# Patient Record
Sex: Female | Born: 2005
Health system: Southern US, Community
[De-identification: ages and names within clinical notes are randomized; demographics above are authoritative.]

## PROBLEM LIST (undated history)

## (undated) DIAGNOSIS — R112 Nausea with vomiting, unspecified: Secondary | ICD-10-CM

## (undated) DIAGNOSIS — F419 Anxiety disorder, unspecified: Secondary | ICD-10-CM

## (undated) DIAGNOSIS — N83209 Unspecified ovarian cyst, unspecified side: Secondary | ICD-10-CM

## (undated) DIAGNOSIS — Z9889 Other specified postprocedural states: Secondary | ICD-10-CM

## (undated) DIAGNOSIS — T7840XA Allergy, unspecified, initial encounter: Secondary | ICD-10-CM

## (undated) DIAGNOSIS — M2342 Loose body in knee, left knee: Secondary | ICD-10-CM

## (undated) HISTORY — PX: OTHER SURGICAL HISTORY: SHX169

## (undated) HISTORY — DX: Unspecified ovarian cyst, unspecified side: N83.209

---
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
## 2023-03-23 ENCOUNTER — Ambulatory Visit
Admission: RE | Admit: 2023-03-23 | Discharge: 2023-03-23 | Disposition: A | Discharge status: Home / Self Care | Payer: 59 | Source: Ambulatory Visit | Attending: Family Medicine | Admitting: Family Medicine

## 2023-03-23 ENCOUNTER — Ambulatory Visit (INDEPENDENT_AMBULATORY_CARE_PROVIDER_SITE_OTHER): Payer: 59 | Admitting: Radiology

## 2023-03-23 VITALS — BP 111/72 | HR 100 | Temp 98.6°F | Resp 18 | Ht 62.0 in | Wt 130.0 lb

## 2023-03-23 DIAGNOSIS — R059 Cough, unspecified: Secondary | ICD-10-CM | POA: Diagnosis not present

## 2023-03-23 DIAGNOSIS — J019 Acute sinusitis, unspecified: Secondary | ICD-10-CM | POA: Diagnosis not present

## 2023-03-23 MED ORDER — AMOXICILLIN-POT CLAVULANATE 875-125 MG PO TABS: 1.0000 | ORAL_TABLET | Freq: Two times a day (BID) | ORAL | 0 refills | Status: DC

## 2023-03-23 NOTE — Discharge Instructions (Addendum)
The x-ray reading we discussed is preliminary. Your x-ray will be read by a radiologist in next few hours. If there is a discrepancy, you will be contacted, and instructed on a new plan for you care.

## 2023-03-23 NOTE — ED Provider Notes (Signed)
 [facility]    CSN: [id number] Arrival date & time: 03/23/23  1423      History   Chief Complaint Chief Complaint  Patient presents with   Sore Throat    Sick on/off for 2 weeks, cough that hurts, pressure behind eyes, loss of voice, temp 99.0, headache, went to a different urgent care on 12/26. - Entered by patient   Cough    HPI Krista Klein is a 18 y.o. female.   The history is provided by the patient.  Sore Throat Pertinent negatives include no shortness of breath.  Cough Associated symptoms: rhinorrhea and sore throat   Associated symptoms: no shortness of breath and no wheezing   Wet cough, loss of voice, chest congestion, nasal congestion, pressure behind her eyes.  States she was sick at Christmas was seen day after Christmas tested negative for COVID and strep.  Felt better for a few days then symptoms worsened.  Had loss of voice which has improved.  Admits soreness in her chest when she coughs   Past Medical History:  Diagnosis Date   Allergy    environmental   Anxiety    no meds taken per mother has therapy for q month   Loose body of left knee    PONV (postoperative nausea and vomiting)    after cyst removed age 32 or 4    There are no active problems to display for this patient.   Past Surgical History:  Procedure Laterality Date   cyst from neck removed  age 5 or 4   KNEE ARTHROSCOPY Left 03/15/2020   Procedure: ARTHROSCOPY LEFT KNEE WITH LOOSE BODY REMOVAL AND LIMITED SYNOVECTOMY;  Surgeon: Sharl Selinda Dover, MD;  Location: [facility];  Service: Orthopedics;  Laterality: Left;    OB History   No obstetric history on file.      Home Medications    Prior to Admission medications   Medication Sig Start Date End Date Taking? Authorizing Provider  amoxicillin -clavulanate (AUGMENTIN ) 875-125 MG tablet Take 1 tablet by mouth every 12 (twelve) hours. 03/23/23  Yes Jerimy Johanson, Jon, PA  JUNEL FE 1.5/30 1.5-30 MG-MCG  tablet Take 1 tablet by mouth daily. 01/22/23  Yes [provider]  acetaminophen  (TYLENOL ) 500 MG tablet Take 500 mg by mouth every 6 (six) hours as needed.    [provider]  cetirizine (ZYRTEC) 10 MG tablet Take 10 mg by mouth daily as needed for allergies.    [provider]  Ibuprofen 200 MG CAPS Take by mouth.    [provider]  ondansetron  (ZOFRAN  ODT) 4 MG disintegrating tablet Take 1 tablet (4 mg total) by mouth every 8 (eight) hours as needed for nausea or vomiting. 03/15/20   Sharl Selinda Dover, MD    Family History History reviewed. No pertinent family history.  Social History Social History   Tobacco Use   Smoking status: Never   Smokeless tobacco: Never  Vaping Use   Vaping status: Never Used  Substance Use Topics   Alcohol use: Never   Drug use: Never     Allergies   Patient has no known allergies.   Review of Systems Review of Systems  Constitutional:  Positive for fatigue.  HENT:  Positive for rhinorrhea, sinus pressure, sore throat and voice change. Negative for ear discharge and facial swelling.   Respiratory:  Positive for cough. Negative for shortness of breath and wheezing.      Physical Exam Triage Vital Signs ED  Triage Vitals  Encounter Vitals Group     BP 03/23/23 1444 (!) 145/95     Systolic BP Percentile --      Diastolic BP Percentile --      Pulse Rate 03/23/23 1444 100     Resp 03/23/23 1444 18     Temp 03/23/23 1444 98.6 F (37 C)     Temp Source 03/23/23 1444 Oral     SpO2 03/23/23 1444 97 %     Weight 03/23/23 1442 130 lb (59 kg)     Height 03/23/23 1442 5' 2 (1.575 m)     Head Circumference --      Peak Flow --      Pain Score 03/23/23 1441 2     Pain Loc --      Pain Education --      Exclude from Growth Chart --    No data found.  Updated Vital Signs BP 111/72 (BP Location: Right Arm) Comment: retake.  Pulse 100   Temp 98.6 F (37 C) (Oral)   Resp 18   Ht 5' 2 (1.575 m)    Wt 130 lb (59 kg)   LMP 03/15/2023 (Exact Date)   SpO2 97%   BMI 23.78 kg/m   Visual Acuity Right Eye Distance:   Left Eye Distance:   Bilateral Distance:    Right Eye Near:   Left Eye Near:    Bilateral Near:     Physical Exam Vitals and nursing note reviewed.  Constitutional:      Appearance: She is not ill-appearing.  HENT:     Head: Normocephalic and atraumatic.     Right Ear: Tympanic membrane and ear canal normal.     Left Ear: Tympanic membrane and ear canal normal.     Nose: Congestion present. No rhinorrhea.     Mouth/Throat:     Mouth: Mucous membranes are moist.     Pharynx: No pharyngeal swelling, oropharyngeal exudate, posterior oropharyngeal erythema or uvula swelling.     Tonsils: No tonsillar exudate or tonsillar abscesses.     Comments: Hoarse voice, normal swallowing Eyes:     Conjunctiva/sclera: Conjunctivae normal.  Cardiovascular:     Rate and Rhythm: Tachycardia present.     Heart sounds: Normal heart sounds.  Pulmonary:     Effort: Pulmonary effort is normal. No respiratory distress.     Breath sounds: Normal breath sounds. No wheezing, rhonchi or rales.  Musculoskeletal:     Cervical back: Neck supple.  Lymphadenopathy:     Cervical: No cervical adenopathy.  Neurological:     Mental Status: She is alert.      UC Treatments / Results  Labs (all labs ordered are listed, but only abnormal results are displayed) Labs Reviewed - No data to display  EKG   Radiology No results found.  Procedures Procedures (including critical care time)  Medications Ordered in UC Medications - No data to display  Initial Impression / Assessment and Plan / UC Course  I have reviewed the triage vital signs and the nursing notes.  Pertinent labs & imaging results that were available during my care of the patient were reviewed by me and considered in my medical decision making (see chart for details).     18 year old female with febrile illness  started on Christmas Day, was better for a few days and symptoms recurred.  Admits with cough, her lungs are clear to auscultation but she is mildly tachycardic.  No known contacts with illness.  Will check chest x-ray Chest x-ray independently viewed by me NAD.  Admits yellow nasal drainage and pressure behind eyes will treat for sinusitis OTC Robitussin DM for cough, follow-up with PCP, discussed with patient x-ray will be read by radiologist we will contact patient if there is a radiologic discrepancy Final Clinical Impressions(s) / UC Diagnoses   Final diagnoses:  Cough, unspecified type  Acute sinusitis, recurrence not specified, unspecified location     Discharge Instructions      The x-ray reading we discussed is preliminary. Your x-ray will be read by a radiologist in next few hours. If there is a discrepancy, you will be contacted, and instructed on a new plan for you care.       ED Prescriptions     Medication Sig Dispense Auth. Provider   amoxicillin -clavulanate (AUGMENTIN ) 875-125 MG tablet Take 1 tablet by mouth every 12 (twelve) hours. 14 tablet Ada Holness, [place]      PDMP not reviewed this encounter.   January Bergthold, [place] 03/23/23 1513

## 2023-03-23 NOTE — ED Triage Notes (Signed)
 Pt presents with complaints of cough, headaches, loss of voice, and some congestion (in chest and nasal cavity). Pt states she was seen at another urgent care facility on 12/26, prescribed prednisone & there was improvement. However, pt's work required her to be outside on Thursday 1/2. Pt's symptoms returned and were worse than before. Only other medication taken is Zyrtec. Pt currently rates her overall pain a 2/10.

## 2023-03-27 ENCOUNTER — Ambulatory Visit: Payer: 59 | Admitting: Family

## 2023-03-27 VITALS — BP 118/63 | HR 91 | Temp 98.6°F | Resp 16 | Ht 62.5 in | Wt 133.0 lb

## 2023-03-27 DIAGNOSIS — H7202 Central perforation of tympanic membrane, left ear: Secondary | ICD-10-CM | POA: Insufficient documentation

## 2023-03-27 DIAGNOSIS — R052 Subacute cough: Secondary | ICD-10-CM | POA: Diagnosis not present

## 2023-03-27 DIAGNOSIS — T7840XA Allergy, unspecified, initial encounter: Secondary | ICD-10-CM | POA: Insufficient documentation

## 2023-03-27 DIAGNOSIS — H7292 Unspecified perforation of tympanic membrane, left ear: Secondary | ICD-10-CM | POA: Insufficient documentation

## 2023-03-27 DIAGNOSIS — T7840XD Allergy, unspecified, subsequent encounter: Secondary | ICD-10-CM | POA: Diagnosis not present

## 2023-03-27 NOTE — Assessment & Plan Note (Addendum)
 Stable with zyrtec.  Did shots as a child. Monitor.

## 2023-03-27 NOTE — Assessment & Plan Note (Signed)
 Secondary to trauma about 18 months ago. Has some perceived hearing loss and occasional discomfort. Refer to ENT for further evaluation.

## 2023-03-27 NOTE — Patient Instructions (Signed)
 VISIT SUMMARY:  You visited today due to a respiratory illness that started last Thursday, with symptoms including a sore throat, cough, voice loss, pressure behind the eyes, and a headache. You have a history of environmental allergies, a previous knee injury, and a past ear injury. We discussed your current symptoms and ongoing management for these conditions.  YOUR PLAN:  -UPPER RESPIRATORY INFECTION: An upper respiratory infection is a common viral infection that affects the nose, throat, and airways. Continue taking Robitussin as needed for your cough. If it does not help, we may consider prescribing a stronger cough medicine.  -ENVIRONMENTAL ALLERGIES: Environmental allergies are reactions to substances like dust, grass, and tree pollen. Continue taking Zyrtec as needed. If your symptoms get worse, we might refer you to an allergist.  -LEFT KNEE INJURY: You have a history of a dislocated knee that required surgery. Currently, you have occasional pain and clicking. Keep monitoring your knee, and seek medical attention if the pain or discomfort increases.  -PERFORATED EARDRUM: A perforated eardrum is a hole or tear in the eardrum, often caused by injury. This can lead to hearing loss and discomfort. We will refer you to an Ear, Nose, and Throat specialist for further evaluation and possible surgical repair.  -GENERAL HEALTH MAINTENANCE: It is important to schedule a physical examination at your convenience to review your overall health and address any other concerns.  INSTRUCTIONS:  Please continue taking Robitussin as needed for your cough. If it does not help, contact us  for a prescription cough medicine. Continue taking Zyrtec for your allergies as needed. Monitor your knee for any increase in pain or discomfort and seek medical attention if needed. We will refer you to an Ear, Nose, and Throat specialist for your perforated eardrum. Additionally, schedule a physical examination at your  convenience.

## 2023-03-27 NOTE — Assessment & Plan Note (Signed)
 Residual from recent respiratory infection. She will complete augmentin for sinusitis and continue Robitussin as needed for cough.

## 2023-03-27 NOTE — Progress Notes (Signed)
 Subjective:     Patient ID: Krista Klein, female    DOB: 07-31-2005, 18 y.o.   MRN: [id number]  No chief complaint on file.   HPI  Discussed the use of AI scribe software for clinical note transcription with the patient, who gave verbal consent to proceed.  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 She was recovering from an illness when she was required to work outside for two hours in 32-degree weather, which she believes led to her current illness. Symptoms include a sore throat, cough, voice loss, pressure behind the eyes, and a headache. She visited urgent care and was prescribed augmentin  for sinusitis, which has led to some improvement, but the cough persists. She has some robitussin at home that she has found helpful.  The patient also has a history of environmental allergies, including dust and various grass and tree pollens. She was previously treated with allergy shots and currently manages her symptoms with Zyrtec. Her main symptom is sneezing, which can fluctuate with the changing seasons.  She also has a history of a dislocated knee from playing soccer, which required arthroscopic surgery to remove cartilage. The knee occasionally bothers her if she is on her leg for a while or if she is moving and walking a lot at work.  Additionally, the patient had a cyst removed from her neck as a child and had an ovarian cyst that resolved without intervention. She also mentions an issue with her ear, which was injured by a water balloon a year and a half ago. She believes her eardrum was ruptured, leading to decreased hearing and discomfort due to changes in air pressure.         Health Maintenance Due  Topic Date Due   Pneumococcal Vaccine 71-24 Years old (1 of 2 - PCV) Never done   CHLAMYDIA SCREENING  Never done   HPV VACCINES (1 - 3-dose series) Never done   HIV  Screening  Never done   Hepatitis C Screening  Never done   DTaP/Tdap/Td (1 - Tdap) Never done   INFLUENZA VACCINE  Never done   COVID-19 Vaccine (1 - 2024-25 season) Never done    Past Medical History:  Diagnosis Date   Allergy    environmental   Anxiety    no meds taken per mother has therapy for q month   Loose body of left knee    Ovarian cyst    PONV (postoperative nausea and vomiting)    after cyst removed age 32 or 4    Past Surgical History:  Procedure Laterality Date   cyst from neck removed  age 1 or 4   KNEE ARTHROSCOPY Left 03/15/2020   Procedure: ARTHROSCOPY LEFT KNEE WITH LOOSE BODY REMOVAL AND LIMITED SYNOVECTOMY;  Surgeon: Sharl Selinda Dover, MD;  Location: [facility] [place];  Service: Orthopedics;  Laterality: Left;    History reviewed. No pertinent family history.  Social History   Socioeconomic History   Marital status: Single    Spouse name: Not on file   Number of children: Not on file   Years of education: Not on file   Highest education level: Some college, no degree  Occupational History   Not on file  Tobacco Use   Smoking status: Never   Smokeless tobacco: Never  Vaping Use   Vaping status: Never Used  Substance and Sexual Activity  Alcohol use: Never   Drug use: Never   Sexual activity: Not Currently    Partners: Male    Birth control/protection: Pill  Other Topics Concern   Not on file  Social History Narrative   Lives with mother and father joint custody, with mother more ( has siblings in both homes)   [employer]- studying Psychology   No pasive smoke exposure   Works at [employer]   Enjoys reading, video games, relaxing   Social Drivers of Corporate Investment Banker Strain: Low Risk  (03/27/2023)   Overall Financial Resource Strain (CARDIA)    Difficulty of Paying Living Expenses: Not hard at all  Food Insecurity: No Food Insecurity (03/27/2023)   Hunger Vital Sign    Worried About Running Out of Food in the Last  Year: Never true    Ran Out of Food in the Last Year: Never true  Transportation Needs: No Transportation Needs (03/27/2023)   PRAPARE - Administrator, Civil Service (Medical): No    Lack of Transportation (Non-Medical): No  Physical Activity: Unknown (03/27/2023)   Exercise Vital Sign    Days of Exercise per Week: 0 days    Minutes of Exercise per Session: Not on file  Stress: No Stress Concern Present (03/27/2023)   [employer] of Occupational Health - Occupational Stress Questionnaire    Feeling of Stress : Only a little  Social Connections: Moderately Isolated (03/27/2023)   Social Connection and Isolation Panel [NHANES]    Frequency of Communication with Friends and Family: More than three times a week    Frequency of Social Gatherings with Friends and Family: Three times a week    Attends Religious Services: 1 to 4 times per year    Active Member of Clubs or Organizations: No    Attends Engineer, Structural: Not on file    Marital Status: Never married  Intimate Partner Violence: Not on file    Outpatient Medications Prior to Visit  Medication Sig Dispense Refill   acetaminophen  (TYLENOL ) 500 MG tablet Take 500 mg by mouth every 6 (six) hours as needed.     amoxicillin -clavulanate (AUGMENTIN ) 875-125 MG tablet Take 1 tablet by mouth every 12 (twelve) hours. 14 tablet 0   cetirizine (ZYRTEC) 10 MG tablet Take 10 mg by mouth daily as needed for allergies.     Ibuprofen 200 MG CAPS Take by mouth.     JUNEL FE 1.5/30 1.5-30 MG-MCG tablet Take 1 tablet by mouth daily.     ondansetron  (ZOFRAN  ODT) 4 MG disintegrating tablet Take 1 tablet (4 mg total) by mouth every 8 (eight) hours as needed for nausea or vomiting. 20 tablet 0   No facility-administered medications prior to visit.    No Known Allergies  ROS    See HPI Objective:    Physical Exam Constitutional:      General: She is not in acute distress.    Appearance: Normal appearance. She is  well-developed.  HENT:     Head: Normocephalic and atraumatic.     Right Ear: Tympanic membrane, ear canal and external ear normal.     Left Ear: External ear normal. Tympanic membrane is perforated.     Ears:   Eyes:     General: No scleral icterus. Neck:     Thyroid : No thyromegaly.  Cardiovascular:     Rate and Rhythm: Normal rate and regular rhythm.     Heart sounds: Normal heart sounds. No murmur heard. Pulmonary:  Effort: Pulmonary effort is normal. No respiratory distress.     Breath sounds: Normal breath sounds. No wheezing.  Musculoskeletal:     Cervical back: Neck supple.  Skin:    General: Skin is warm and dry.  Neurological:     Mental Status: She is alert and oriented to person, place, and time.  Psychiatric:        Mood and Affect: Mood normal.        Behavior: Behavior normal.        Thought Content: Thought content normal.        Judgment: Judgment normal.      BP 118/63 (BP Location: Right Arm, Patient Position: Sitting, Cuff Size: Small)   Pulse 91   Temp 98.6 F (37 C) (Oral)   Resp 16   Ht 5' 2.5 (1.588 m)   Wt 133 lb (60.3 kg)   LMP 03/15/2023 (Exact Date)   SpO2 99%   BMI 23.94 kg/m  Wt Readings from Last 3 Encounters:  03/27/23 133 lb (60.3 kg) (59%, Z= 0.22)*  03/23/23 130 lb (59 kg) (54%, Z= 0.09)*  03/15/20 109 lb 3.2 oz (49.5 kg) (25%, Z= -0.68)*   * Growth percentiles are based on CDC (Girls, 2-20 Years) data.       Assessment & Plan:   Problem List Items Addressed This Visit       Unprioritized   Subacute cough   Residual from recent respiratory infection. She will complete augmentin  for sinusitis and continue Robitussin as needed for cough.       Perforation of left tympanic membrane   Secondary to trauma about 18 months ago. Has some perceived hearing loss and occasional discomfort. Refer to ENT for further evaluation.       Relevant Orders   Ambulatory referral to ENT   Allergy - Primary   Stable with zyrtec.   Did shots as a child. Monitor.        I have discontinued Nuriya N. Ritacco's ondansetron . I am also having her maintain her cetirizine, acetaminophen , Ibuprofen, Junel FE 1.5/30, and amoxicillin -clavulanate.  No orders of the defined types were placed in this encounter.

## 2023-03-31 ENCOUNTER — Encounter (INDEPENDENT_AMBULATORY_CARE_PROVIDER_SITE_OTHER): Payer: Self-pay | Admitting: Otolaryngology

## 2023-04-27 ENCOUNTER — Encounter: Payer: Self-pay | Admitting: Family

## 2023-04-27 ENCOUNTER — Telehealth: Payer: Self-pay | Admitting: Family

## 2023-04-27 ENCOUNTER — Ambulatory Visit (INDEPENDENT_AMBULATORY_CARE_PROVIDER_SITE_OTHER): Payer: 59 | Admitting: Family

## 2023-04-27 VITALS — BP 108/63 | HR 91 | Temp 97.4°F | Resp 16 | Ht 62.0 in | Wt 131.0 lb

## 2023-04-27 DIAGNOSIS — Z0000 Encounter for general adult medical examination without abnormal findings: Secondary | ICD-10-CM

## 2023-04-27 DIAGNOSIS — H7292 Unspecified perforation of tympanic membrane, left ear: Secondary | ICD-10-CM | POA: Diagnosis not present

## 2023-04-27 NOTE — Telephone Encounter (Signed)
 Electronic request sent

## 2023-04-27 NOTE — Assessment & Plan Note (Signed)
 Vaccination Tetanus up to date for 1 more year. She has not received HPV vaccination.  Discussed the benefits of HPV vaccination in preventing cervical cancer. -Provided patient with information on HPV vaccine for consideration.   Diet and Exercise  -Currently on a calorie deficit diet and starting to go to the gym. No medical concerns regarding weight.  -Encouragement of healthy diet and exercise habits. General Health Maintenance -Request gynecology records from [facility] which included lipid panel and chlamydia screening per patient.  -Advised her that she is currently at healthy BMI. States she wishes to tone up more.

## 2023-04-27 NOTE — Patient Instructions (Signed)
 VISIT SUMMARY:  Today, you came in for your annual physical exam. We discussed your current health status, including your diet, exercise routine, and social anxiety. We also reviewed your immunization status and general health maintenance needs.  YOUR PLAN:  -IMMUNIZATION STATUS: Your are due for the Gardisil vaccine to help prevent cervical cancer.  Please review the educational material we gave you and let me know if you wish to proceed with vaccination.   -ANXIETY: You mentioned experiencing social anxiety, which makes it difficult to interact with strangers and manage tasks like homework. No changes to your current management plan were discussed today.  -DIET AND EXERCISE: You are on a calorie deficit diet and have started going to the gym, aiming for a leaner physique. There are no medical concerns regarding your weight, and you are encouraged to continue your healthy diet and exercise habits.  -GENERAL HEALTH MAINTENANCE: We will request your gynecology records from [facility] and consider a cholesterol check if it hasn't been done previously.  INSTRUCTIONS:  Please follow up with the ENT specialist as planned. Additionally, request your immunization records from [facility] and consider getting the HPV vaccine. Continue with your healthy diet and exercise routine, and address your jaw alignment at your next dental visit.

## 2023-04-27 NOTE — Progress Notes (Signed)
 Subjective:     Patient ID: Krista Klein, female    DOB: 04/30/2005, 18 y.o.   MRN: [id number]  Chief Complaint  Patient presents with   Annual Exam    +9    HPI  Discussed the use of AI scribe software for clinical note transcription with the patient, who gave verbal consent to proceed.  History of Present Illness   Krista Klein is a 18 year old female who presents for an annual physical exam.  She has no specific concerns at this time and is awaiting an upcoming appointment with an ENT specialist for a referral she received.  She is on a calorie deficit diet, focusing on healthier eating habits by choosing grilled chicken over fried options and incorporating fruit into her meals. She works at [employer] and is trying to improve her protein intake. She has started going to the gym to achieve a leaner physique.  She is sexually active and has had recent testing for sexually transmitted infections, including a blood test, at her gynecologist's office, where she also receives her birth control. She has not received the HPV vaccine series and is unsure about her flu vaccination status for this year. She has not had an eye exam recently but reports having 20/20 vision during her last check-up. Her dental care is up to date.  She experiences frequent headaches, which she attributes to a possible ear issue and a slight jaw misalignment, as suggested by her stepmother, a Armed forces operational officer. She plans to address the jaw alignment at her next dental visit.  She acknowledges having social anxiety, which affects her interactions with strangers and new environments. 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 No concerns about depression.      Immunizations: declines flu shot, declines gardisil. Tdap good for 1 more year. Diet: counts her calories, trying to eat healthier.   Exercise: goes the gym Sexually active.  Vision: up to date Dental: up to  date      Health Maintenance Due  Topic Date Due   Pneumococcal Vaccine 21-18 Years old (1 of 2 - PCV) 06/19/2009   DTaP/Tdap/Td (6 - Tdap) 06/20/2014   CHLAMYDIA SCREENING  Never done   HPV VACCINES (1 - 3-dose series) Never done   HIV Screening  Never done   Hepatitis C Screening  Never done   INFLUENZA VACCINE  10/16/2022   COVID-19 Vaccine (1 - 2024-25 season) 11/16/2022    Past Medical History:  Diagnosis Date   Allergy    environmental   Anxiety    no meds taken per mother has therapy for q month   Loose body of left knee    Ovarian cyst    PONV (postoperative nausea and vomiting)    after cyst removed age 26 or 4    Past Surgical History:  Procedure Laterality Date   cyst from neck removed  age 54 or 4   KNEE ARTHROSCOPY Left 03/15/2020   Procedure: ARTHROSCOPY LEFT KNEE WITH LOOSE BODY REMOVAL AND LIMITED SYNOVECTOMY;  Surgeon: Janeth Medicus, MD;  Location: [facility] [place];  Service: Orthopedics;  Laterality: Left;    History reviewed. No pertinent family history.  Social History   Socioeconomic History   Marital status: Single    Spouse name: Not on file   Number of children: Not on file   Years of education: Not on file   Highest education level: Some college, no degree  Occupational History  Not on file  Tobacco Use   Smoking status: Never   Smokeless tobacco: Never  Vaping Use   Vaping status: Never Used  Substance and Sexual Activity   Alcohol use: Never   Drug use: Never   Sexual activity: Yes    Partners: Male    Birth control/protection: Pill  Other Topics Concern   Not on file  Social History Narrative   Lives with mother and father joint custody, with mother more ( has siblings in both homes)   [employer]- studying Psychology   No pasive smoke exposure   Works at [employer]   Enjoys reading, video games, relaxing   Social Drivers of Corporate investment banker Strain: Low Risk  (03/27/2023)   Overall Financial  Resource Strain (CARDIA)    Difficulty of Paying Living Expenses: Not hard at all  Food Insecurity: No Food Insecurity (03/27/2023)   Hunger Vital Sign    Worried About Running Out of Food in the Last Year: Never true    Ran Out of Food in the Last Year: Never true  Transportation Needs: No Transportation Needs (03/27/2023)   PRAPARE - Administrator, Civil Service (Medical): No    Lack of Transportation (Non-Medical): No  Physical Activity: Unknown (03/27/2023)   Exercise Vital Sign    Days of Exercise per Week: 0 days    Minutes of Exercise per Session: Not on file  Stress: No Stress Concern Present (03/27/2023)   [employer] of Occupational Health - Occupational Stress Questionnaire    Feeling of Stress : Only a little  Social Connections: Moderately Isolated (03/27/2023)   Social Connection and Isolation Panel [NHANES]    Frequency of Communication with Friends and Family: More than three times a week    Frequency of Social Gatherings with Friends and Family: Three times a week    Attends Religious Services: 1 to 4 times per year    Active Member of Clubs or Organizations: No    Attends Engineer, structural: Not on file    Marital Status: Never married  Intimate Partner Violence: Not on file    Outpatient Medications Prior to Visit  Medication Sig Dispense Refill   acetaminophen  (TYLENOL ) 500 MG tablet Take 500 mg by mouth every 6 (six) hours as needed.     cetirizine (ZYRTEC) 10 MG tablet Take 10 mg by mouth daily as needed for allergies.     Ibuprofen 200 MG CAPS Take by mouth.     JUNEL FE 1.5/30 1.5-30 MG-MCG tablet Take 1 tablet by mouth daily.     amoxicillin -clavulanate (AUGMENTIN ) 875-125 MG tablet Take 1 tablet by mouth every 12 (twelve) hours. 14 tablet 0   No facility-administered medications prior to visit.    No Known Allergies  Review of Systems  Constitutional:  Negative for weight loss.  HENT:  Positive for hearing loss (mild  decrease in hearing left ear). Negative for congestion.   Eyes:  Negative for blurred vision.  Respiratory:  Positive for hemoptysis.   Cardiovascular:  Negative for leg swelling.  Gastrointestinal:  Negative for constipation and diarrhea.  Genitourinary:  Negative for dysuria.  Musculoskeletal:  Negative for joint pain and myalgias.  Skin:  Negative for rash.  Neurological:  Positive for headaches (she attributes this to ear issues).  Psychiatric/Behavioral:         Denies depression. Social anxiety, has been improving since she started working at [employer] file       Objective:  Physical Exam   BP 108/63 (BP Location: Right Arm, Patient Position: Sitting, Cuff Size: Small)   Pulse 91   Temp (!) 97.4 F (36.3 C) (Oral)   Resp 16   Ht 5\' 2"  (1.575 m)   Wt 131 lb (59.4 kg)   SpO2 100%   BMI 23.96 kg/m  Wt Readings from Last 3 Encounters:  04/27/23 131 lb (59.4 kg) (55%, Z= 0.13)*  03/27/23 133 lb (60.3 kg) (59%, Z= 0.22)*  03/23/23 130 lb (59 kg) (54%, Z= 0.09)*   * Growth percentiles are based on CDC (Girls, 2-20 Years) data.   Physical Exam  Constitutional: She is oriented to person, place, and time. She appears well-developed and well-nourished. No distress.  HENT:  Head: Normocephalic and atraumatic.  Right Ear: Tympanic membrane and ear canal normal.  Left Ear: Tympanic membrane perforated Mouth/Throat: Oropharynx is clear and moist.  Eyes: Pupils are equal, round, and reactive to light. No scleral icterus.  Neck: Normal range of motion. No thyromegaly present.  Cardiovascular: Normal rate and regular rhythm.   No murmur heard. Pulmonary/Chest: Effort normal and breath sounds normal. No respiratory distress. He has no wheezes. She has no rales. She exhibits no tenderness.  Abdominal: Soft. Bowel sounds are normal. She exhibits no distension and no mass. There is no tenderness. There is no rebound and no guarding.  Musculoskeletal: She exhibits no edema.   Lymphadenopathy:    She has no cervical adenopathy.  Neurological: She is alert and oriented to person, place, and time. She has normal patellar reflexes. She exhibits normal muscle tone. Coordination normal.  Skin: Skin is warm and dry.  Psychiatric: She has a normal mood and affect. Her behavior is normal. Judgment and thought content normal.  Breast/pelvic: deferred          Assessment & Plan:       Assessment & Plan:   Problem List Items Addressed This Visit       Unprioritized   Preventative health care - Primary   Vaccination Tetanus up to date for 1 more year. She has not received HPV vaccination.  Discussed the benefits of HPV vaccination in preventing cervical cancer. -Provided patient with information on HPV vaccine for consideration.   Diet and Exercise  -Currently on a calorie deficit diet and starting to go to the gym. No medical concerns regarding weight.  -Encouragement of healthy diet and exercise habits. General Health Maintenance -Request gynecology records from [facility] which included lipid panel and chlamydia screening per patient.  -Advised her that she is currently at healthy BMI. States she wishes to tone up more.       Perforation of left tympanic membrane   Has appointment scheduled with ENT.        I have discontinued Arien N. Villamor's amoxicillin -clavulanate. I am also having her maintain her cetirizine, acetaminophen , Ibuprofen, and Junel FE 1.5/30.  No orders of the defined types were placed in this encounter.

## 2023-04-27 NOTE — Assessment & Plan Note (Signed)
 Has appointment scheduled with ENT.

## 2023-04-27 NOTE — Telephone Encounter (Signed)
 Please call [facility] and request labs.  Including chlamydia screening.

## 2023-05-22 ENCOUNTER — Ambulatory Visit (INDEPENDENT_AMBULATORY_CARE_PROVIDER_SITE_OTHER): Payer: 59 | Admitting: Otolaryngology

## 2023-05-22 ENCOUNTER — Encounter (INDEPENDENT_AMBULATORY_CARE_PROVIDER_SITE_OTHER): Payer: Self-pay

## 2023-05-22 VITALS — BP 112/73 | HR 88 | Ht 62.0 in | Wt 127.0 lb

## 2023-05-22 DIAGNOSIS — H7202 Central perforation of tympanic membrane, left ear: Secondary | ICD-10-CM

## 2023-05-22 DIAGNOSIS — H7292 Unspecified perforation of tympanic membrane, left ear: Secondary | ICD-10-CM

## 2023-05-25 NOTE — Progress Notes (Signed)
 Patient ID: Krista Klein, female   DOB: [birth year], 18 y.o.   MRN: [id number]  CC: Left tympanic membrane perforation  HPI:  Krista Klein is a 18 y.o. female who presents today for evaluation of her left tympanic membrane perforation.  According to the patient, she was hit on her left ear by a water balloon 1-1/2 years ago.  It resulted in significant left ear pain and tinnitus.  She denies any hearing loss or vertigo.  She was evaluated by her primary care physician, and was noted to have a left tympanic membrane perforation.  The patient has no previous ENT surgery.  She denies any recent otitis media or otitis externa.  Past Medical History:  Diagnosis Date   Allergy    environmental   Anxiety    no meds taken per mother has therapy for q month   Loose body of left knee    Ovarian cyst    PONV (postoperative nausea and vomiting)    after cyst removed age 1 or 4    Past Surgical History:  Procedure Laterality Date   cyst from neck removed  age 64 or 4   KNEE ARTHROSCOPY Left 03/15/2020   Procedure: ARTHROSCOPY LEFT KNEE WITH LOOSE BODY REMOVAL AND LIMITED SYNOVECTOMY;  Surgeon: Yolonda Kida, MD;  Location: [facility] [place];  Service: Orthopedics;  Laterality: Left;    History reviewed. No pertinent family history.  Social History:  reports that she has never smoked. She has never used smokeless tobacco. She reports that she does not drink alcohol and does not use drugs.  Allergies:  Allergies  Allergen Reactions   Grass Pollen(K-O-R-T-Swt Vern) Cough, Hives and Shortness Of Breath   Molds & Smuts Cough and Shortness Of Breath    Prior to Admission medications   Medication Sig Start Date End Date Taking? Authorizing Provider  cetirizine (ZYRTEC) 10 MG tablet Take 10 mg by mouth daily as needed for allergies.   Yes [provider]  Ibuprofen 200 MG CAPS Take by mouth.   Yes [provider]  JUNEL FE 1.5/30 1.5-30 MG-MCG tablet Take 1  tablet by mouth daily. 01/22/23  Yes [provider]  acetaminophen (TYLENOL) 500 MG tablet Take 500 mg by mouth every 6 (six) hours as needed. Patient not taking: Reported on 05/22/2023    [provider]    Blood pressure 112/73, pulse 88, height 5\' 2"  (1.575 m), weight 127 lb (57.6 kg), SpO2 99%. Exam: General: Communicates without difficulty, well nourished, no acute distress. Head: Normocephalic, no evidence injury, no tenderness, facial buttresses intact without stepoff. Face/sinus: No tenderness to palpation and percussion. Facial movement is normal and symmetric. Eyes: PERRL, EOMI. No scleral icterus, conjunctivae clear. Neuro: CN II exam reveals vision grossly intact.  No nystagmus at any point of gaze. Ears: Auricles well formed without lesions.  Ear canals are intact without mass or lesion.  No erythema or edema is appreciated.  The right tympanic membrane is intact and mobile.  A small left tympanic membrane perforation is noted.  Nose: External evaluation reveals normal support and skin without lesions.  Dorsum is intact.  Anterior rhinoscopy reveals congested mucosa over anterior aspect of inferior turbinates and intact septum.  No purulence noted. Oral:  Oral cavity and oropharynx are intact, symmetric, without erythema or edema.  Mucosa is moist without lesions. Neck: Full range of motion without pain.  There is no significant lymphadenopathy.  No masses palpable.  Thyroid bed within normal limits  to palpation.  Parotid glands and submandibular glands equal bilaterally without mass.  Trachea is midline. Neuro:  CN 2-12 grossly intact.   Assessment: 1.  The patient has a small left tympanic membrane perforation.  The perforation is less than 5% in size. 2.  The right tympanic membrane is intact and mobile.   3.  No acute otitis media or otitis externa is noted today.  Plan: 1.  The physical exam findings are reviewed with the patient. 2.  Dry ear precautions on the left  side. 3.  The option of myringoplasties to close the TM perforation is discussed.  The risk, benefits, and details of the procedure are reviewed. 4.  The patient would like to consider her options.  She is encouraged to call with any questions or concerns.  Reily Ilic W Barabara Motz 05/25/2023, 7:52 AM

## 2024-03-26 IMAGING — MR MR KNEE*L* W/O CM
6 series · 39 of 40 positions shown · non-contrast
Comparison: None.

CLINICAL DATA: Dislocated patella playing soccer, pain and swelling
noted

EXAM:
MRI OF THE LEFT KNEE WITHOUT CONTRAST
TECHNIQUE: Multiplanar, multisequence MR imaging of the knee was performed. No
intravenous contrast was administered.

[Series 9: T2 fat-sat · axial · left · 4.0mm · 0.50mm/px · z∈[-116,+11]mm · 8 of 30 slices shown (1 of 3)]
[im 1/30]
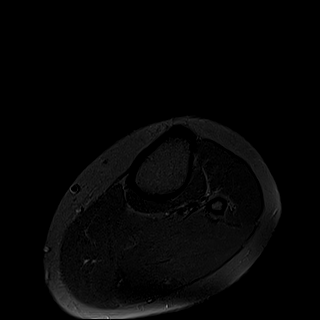
[im 5/30]
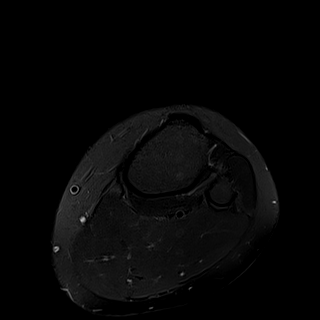
[im 9/30]
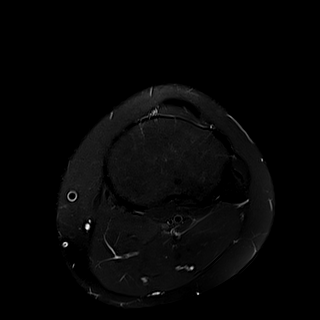
[im 13/30]
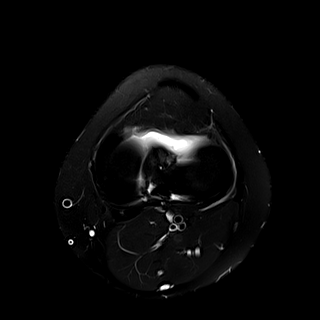
[im 17/30]
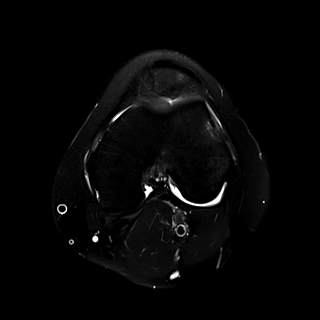
[im 21/30]
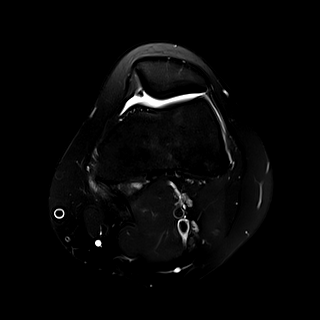
[im 25/30]
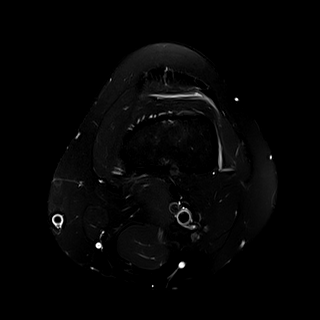
[im 30/30]
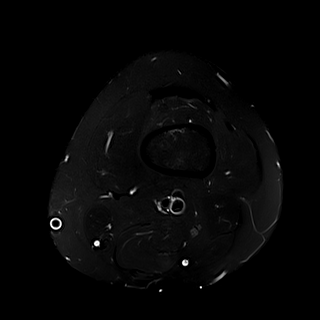

[Series 10: T1 · coronal · left · 4.0mm · 0.29mm/px · 5 of 22 slices shown]
[im 1/22]
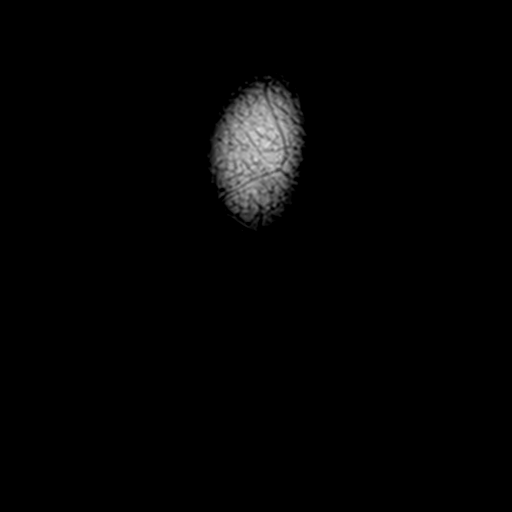
[im 5/22]
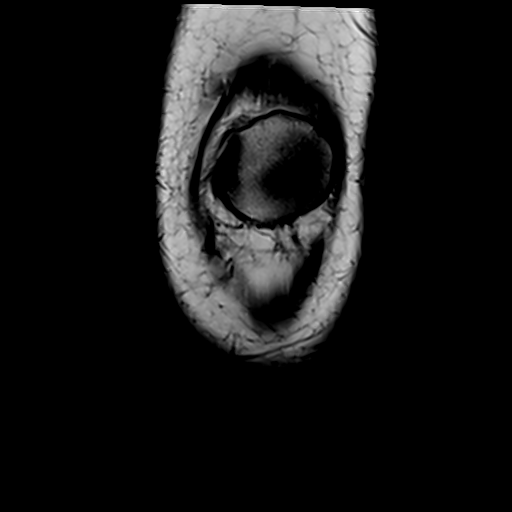
[im 9/22]
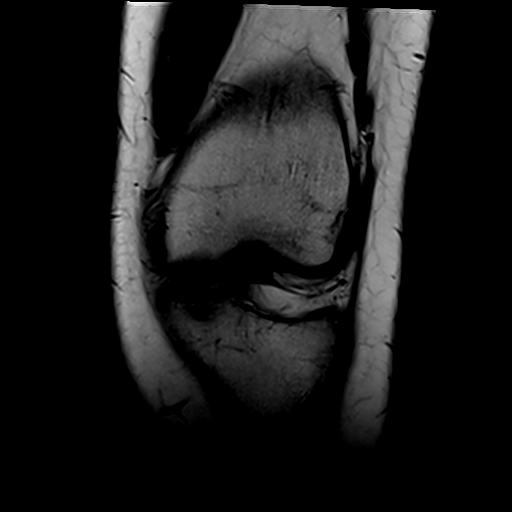
[im 13/22]
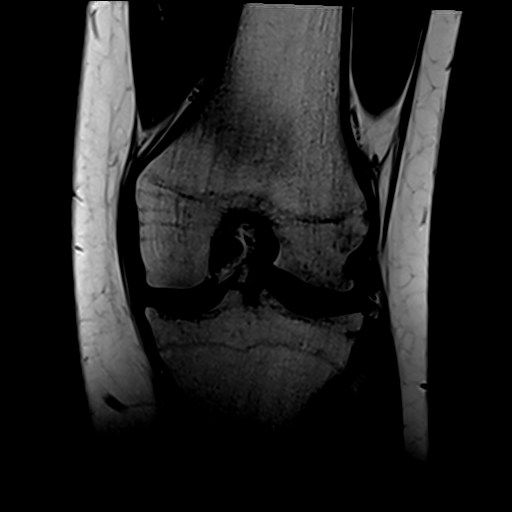
[im 17/22]
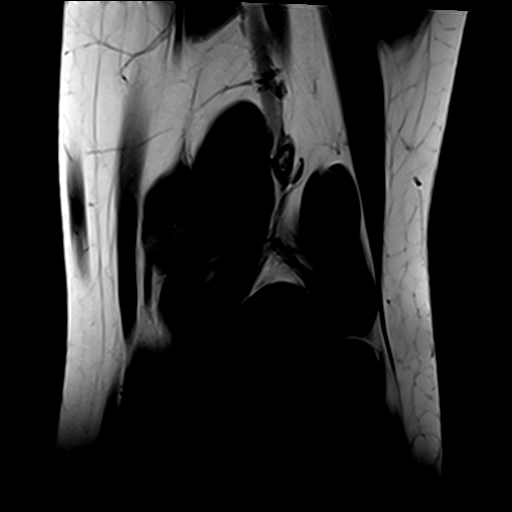

[Series 11: T2 fat-sat · coronal · left · 4.0mm · 0.59mm/px · 6 of 22 slices shown (2 of 3)]
[im 1/22]
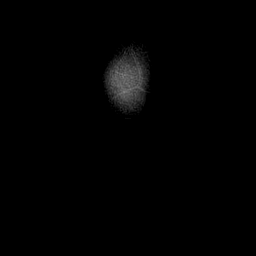
[im 5/22]
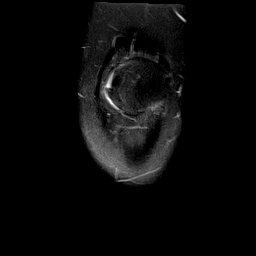
[im 9/22]
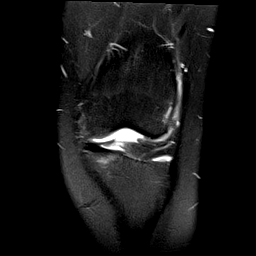
[im 13/22]
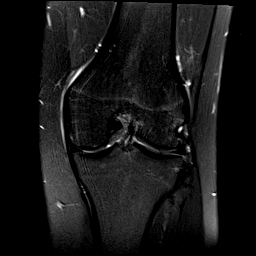
[im 17/22]
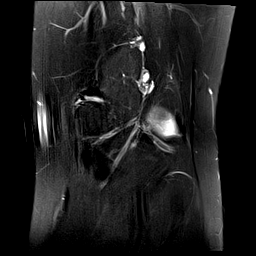
[im 22/22]
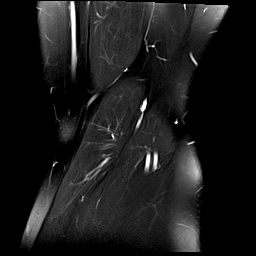

[Series 12: PD fat-sat · coronal · left · 3.0mm · 0.47mm/px · 8 of 28 slices shown (1 of 2)]
[im 1/28]
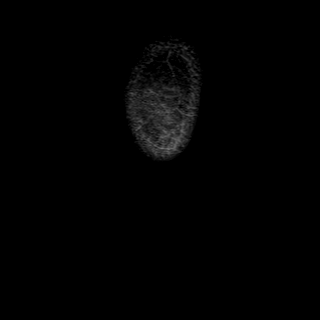
[im 4/28]
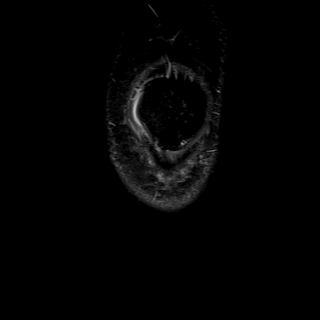
[im 8/28]
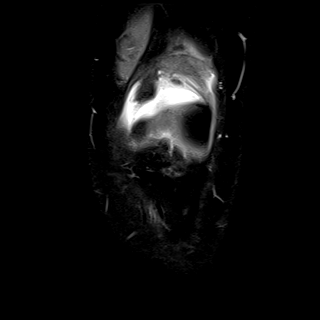
[im 12/28]
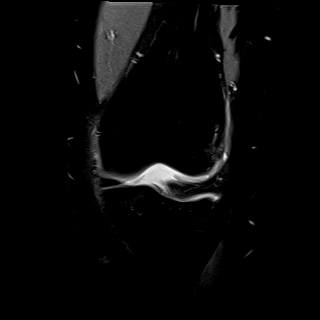
[im 16/28]
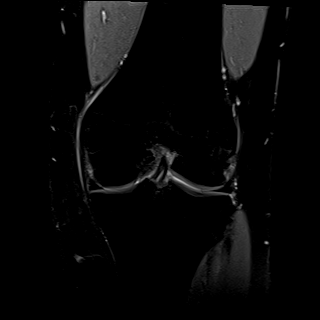
[im 20/28]
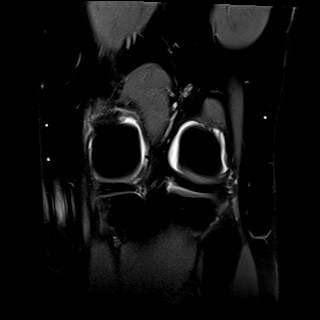
[im 24/28]
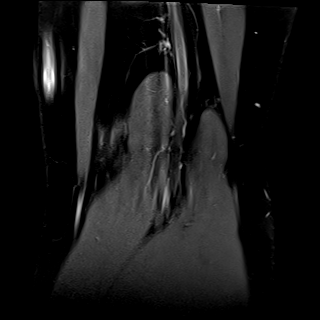
[im 28/28]
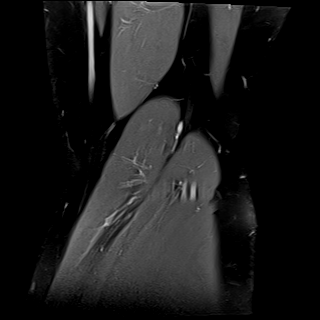

[Series 13: PD fat-sat · sagittal · left · 4.0mm · 0.47mm/px · 6 of 20 slices shown (2 of 2)]
[im 1/20]
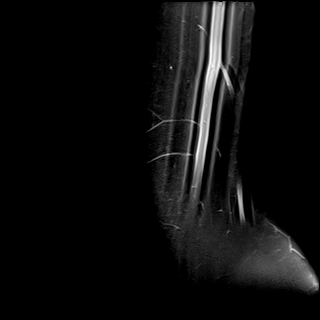
[im 4/20]
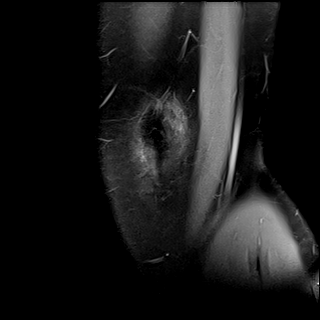
[im 8/20]
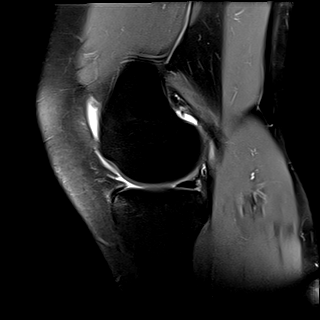
[im 12/20]
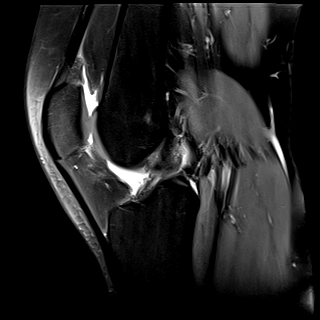
[im 16/20]
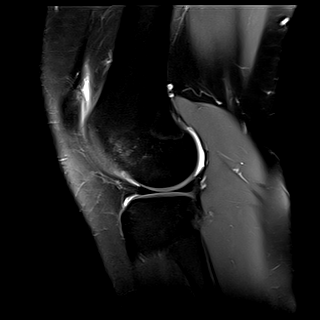
[im 20/20]
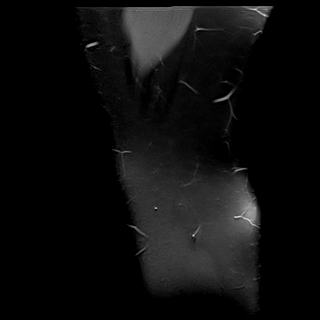

[Series 14: T2 fat-sat · sagittal · left · 4.0mm · 0.47mm/px · 6 of 20 slices shown (3 of 3)]
[im 1/20]
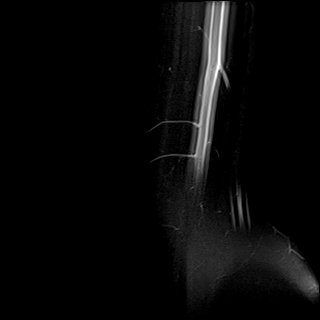
[im 4/20]
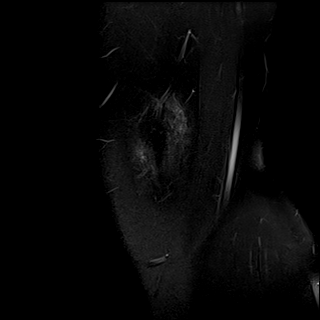
[im 8/20]
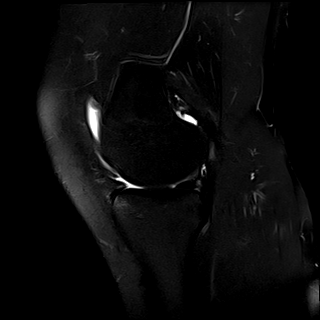
[im 12/20]
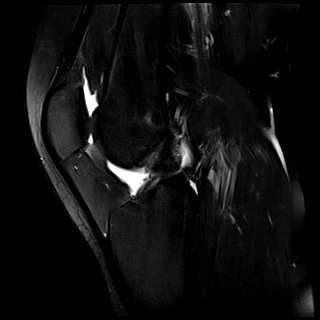
[im 16/20]
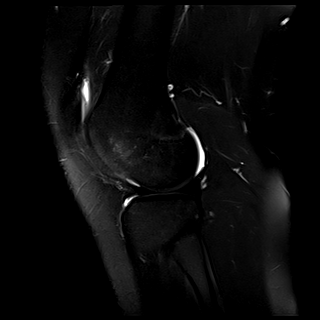
[im 20/20]
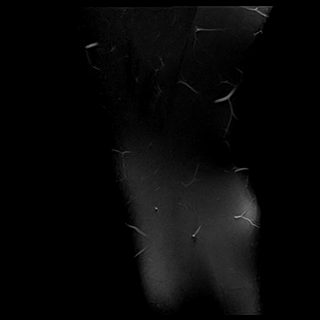

[39 of 40 positions shown; findings below may reference images not displayed]

FINDINGS: MENISCI

Medial: Intact.

Lateral: Intact.

LIGAMENTS

Cruciates: ACL and PCL are intact.

Collaterals: Medial collateral ligament is intact. Lateral
collateral ligament complex is intact.

CARTILAGE

Patellofemoral: Osteochondral impaction injury of the medial patella
(axial T2 images 12-13).

Medial:  No chondral defect.

Lateral:  No chondral defect.

JOINT: Small joint effusion.

POPLITEAL FOSSA: No Baker's cyst.

EXTENSOR MECHANISM: Mild irregularity edema along the femoral aspect
of the MPFL (axial T2 image 15). The lateral retinaculum is intact.
Shallow trochlear groove. Borderline trochlear facet asymmetry.
TT-TG distance measures 12 mm. No patella Alta. Intact quadriceps
tendon. Intact patellar tendon.

BONES: There is a small bony contusion due to impaction injury on
the lateral femoral condyle (axial T2 image 14).

Other: No additional findings.
IMPRESSION: Sequela of transient lateral patellar dislocation, with
osteochondral impaction injury of the medial patella and impaction
injury of the lateral femoral condyle. Low-grade partial tear of the
MPFL near the femoral attachment. Shallow trochlear groove.
Borderline trochlear facet asymmetry. TT-TG distance measures 12 mm.
No patella Alta.

No evidence of meniscus tear. Intact cruciate and collateral
ligaments.

Small joint effusion.
